# Patient Record
Sex: Male | Born: 1990 | Race: White | Hispanic: No | Marital: Single | Smoking: Current every day smoker
Health system: Southern US, Community
[De-identification: ages and names within clinical notes are randomized; demographics above are authoritative.]

## PROBLEM LIST (undated history)

## (undated) HISTORY — PX: TENDON REPAIR: SHX5111

## (undated) HISTORY — PX: WISDOM TOOTH EXTRACTION: SHX21

---
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
## 2013-02-09 ENCOUNTER — Encounter (HOSPITAL_COMMUNITY): Payer: Self-pay | Admitting: Emergency Medicine

## 2013-02-09 ENCOUNTER — Emergency Department (HOSPITAL_COMMUNITY)
Admission: EM | Admit: 2013-02-09 | Discharge: 2013-02-10 | Disposition: A | Discharge status: Home / Self Care | Payer: 59 | Attending: Emergency Medicine | Admitting: Emergency Medicine

## 2013-02-09 ENCOUNTER — Emergency Department (HOSPITAL_COMMUNITY): Payer: 59

## 2013-02-09 DIAGNOSIS — Y9389 Activity, other specified: Secondary | ICD-10-CM | POA: Insufficient documentation

## 2013-02-09 DIAGNOSIS — W540XXA Bitten by dog, initial encounter: Secondary | ICD-10-CM | POA: Insufficient documentation

## 2013-02-09 DIAGNOSIS — Y929 Unspecified place or not applicable: Secondary | ICD-10-CM | POA: Insufficient documentation

## 2013-02-09 DIAGNOSIS — F17200 Nicotine dependence, unspecified, uncomplicated: Secondary | ICD-10-CM | POA: Insufficient documentation

## 2013-02-09 DIAGNOSIS — S61409A Unspecified open wound of unspecified hand, initial encounter: Secondary | ICD-10-CM | POA: Insufficient documentation

## 2013-02-09 DIAGNOSIS — S61451A Open bite of right hand, initial encounter: Secondary | ICD-10-CM

## 2013-02-09 DIAGNOSIS — IMO0002 Reserved for concepts with insufficient information to code with codable children: Secondary | ICD-10-CM | POA: Insufficient documentation

## 2013-02-09 NOTE — ED Notes (Signed)
Pt. presents with dog bite / puncture wound at right posterior hand this evening , pt. stated his dog's immunizations are complete , mild bleeding / swelling .

## 2013-02-10 MED ORDER — HYDROCODONE-ACETAMINOPHEN 5-325 MG PO TABS
1.0000 | ORAL_TABLET | Freq: Once | ORAL | Status: AC
  Administered 2013-02-10: 1 via ORAL
  Filled 2013-02-10: qty 1

## 2013-02-10 MED ORDER — CEPHALEXIN 500 MG PO CAPS: 500.0000 mg | ORAL_CAPSULE | Freq: Four times a day (QID) | ORAL | Status: DC

## 2013-02-10 MED ORDER — HYDROCODONE-ACETAMINOPHEN 5-325 MG PO TABS: 1.0000 | ORAL_TABLET | ORAL | Status: DC | PRN

## 2013-02-10 NOTE — Discharge Instructions (Signed)

## 2013-02-10 NOTE — ED Provider Notes (Signed)
CSN: [id number]     Arrival date & time 02/09/13  2319 History   First MD Initiated Contact with Patient 02/10/13 0001     Chief Complaint  Patient presents with  . Animal Bite   (Consider location/radiation/quality/duration/timing/severity/associated sxs/prior Treatment) HPI  23 year old male presents for evaluation of recent dogbite. Patient reports he was playing with his pit bull and was bitten on right hand. This happened this evening. Bite was provoked. He was hit once on the right hand suffered a small.bite to the dorsum of his right hand. As he was trying to get away he was scratched and bitten once on his left side of buttock. No other injury noted. He is up-to-date with his tetanus shot. Sts his dog's immunization is UTD.  No specific treatment tried. Denies any numbness.   History reviewed. No pertinent past medical history. Past Surgical History  Procedure Laterality Date  . Tendon repair      L wrist   No family history on file. History  Substance Use Topics  . Smoking status: Current Every Day Smoker  . Smokeless tobacco: Not on file  . Alcohol Use: Yes     Comment: occasionally    Review of Systems  Constitutional: Negative for fever.  Skin: Positive for wound. Negative for rash.  Neurological: Negative for numbness.    Allergies  Review of patient's allergies indicates no known allergies.  Home Medications  No current outpatient prescriptions on file. BP 137/63  Pulse 64  Temp(Src) 98.3 F (36.8 C) (Oral)  Resp 14  Ht 6\' 1"  (1.854 m)  Wt 142 lb (64.411 kg)  BMI 18.74 kg/m2  SpO2 98% Physical Exam  Constitutional: He appears well-developed and well-nourished. No distress.  HENT:  Head: Atraumatic.  Eyes: Conjunctivae are normal.  Neck: Normal range of motion. Neck supple.  Cardiovascular: Intact distal pulses.   Musculoskeletal: He exhibits tenderness (R hand: small 7mm puncture wound to mid dorsum of hand without tendon or nerve injury.  intact  sensation distally, normal ROM to all fingers.  no fb, no crepitus).  Neurological: He is alert.  Skin: No rash noted.  L buttock: superficial abrasion and superficial bite marks to L lateral buttock, minimal tenderness, no fb noted.   Psychiatric: He has a normal mood and affect.    ED Course  Procedures (including critical care time)  12:36 AM Patient was seen far dog bite, he was bitten by his dog.  This is a provoked bite from rough playing.  Has superficial wounds that was cared for in ER with thorough irrigation and dressing.  Will d/c with pain meds, abx, hand specialist referral as needed, and return precaution.  Doubt nerve or tendon injury.  Xray neg for acute fx of R hand.   Labs Review Labs Reviewed - No data to display Imaging Review Dg Hand Complete Right  02/10/2013   CLINICAL DATA:  Dog bite to hand.  Puncture wound.  EXAM: RIGHT HAND - COMPLETE 3+ VIEW  COMPARISON:  None.  FINDINGS: Small laceration over the dorsal hand. No radiodense foreign body. No fracture or other osseous abnormality.  IMPRESSION: No osseous abnormality or radiodense foreign body.   Electronically Signed   By: Tiburcio Pea M.D.   On: 02/10/2013 00:12    EKG Interpretation   None       MDM   1. Dog bite of right hand without complication    BP 137/63  Pulse 64  Temp(Src) 98.3 F (36.8 C) (Oral)  Resp 14  Ht 6\' 1"  (1.854 m)  Wt 142 lb (64.411 kg)  BMI 18.74 kg/m2  SpO2 98%  I have reviewed nursing notes and vital signs. I personally reviewed the imaging tests through PACS system  I reviewed available ER/hospitalization records thought the EMR     Fayrene HelperBowie Tori Cupps, [place] JerseyPA-C 02/10/13 0038

## 2013-02-10 NOTE — ED Provider Notes (Signed)
Medical screening examination/treatment/procedure(s) were performed by non-physician practitioner and as supervising physician I was immediately available for consultation/collaboration.  EKG Interpretation   None         Shon Batonourtney F Horton, MD 02/10/13 [id number]

## 2013-02-10 NOTE — ED Notes (Signed)
Presents with abrasions to dorsum to right hand and left buttock.  Area of abrasions to left buttock in a pattern that resembled a tooth pattern.  Brandy EDT cleansed wounds with SAF-Clens AF, covered all with film of bacitracin.  Hand and buttock was dressed as PA requested.  Pt tolerated well.

## 2013-08-24 ENCOUNTER — Emergency Department (HOSPITAL_COMMUNITY): Payer: 59

## 2013-08-24 ENCOUNTER — Encounter (HOSPITAL_COMMUNITY): Payer: Self-pay | Admitting: Emergency Medicine

## 2013-08-24 ENCOUNTER — Emergency Department (HOSPITAL_COMMUNITY)
Admission: EM | Admit: 2013-08-24 | Discharge: 2013-08-24 | Disposition: A | Discharge status: Home / Self Care | Payer: 59 | Attending: Emergency Medicine | Admitting: Emergency Medicine

## 2013-08-24 DIAGNOSIS — Y9389 Activity, other specified: Secondary | ICD-10-CM | POA: Diagnosis not present

## 2013-08-24 DIAGNOSIS — M795 Residual foreign body in soft tissue: Secondary | ICD-10-CM

## 2013-08-24 DIAGNOSIS — W540XXA Bitten by dog, initial encounter: Secondary | ICD-10-CM | POA: Insufficient documentation

## 2013-08-24 DIAGNOSIS — Y929 Unspecified place or not applicable: Secondary | ICD-10-CM | POA: Insufficient documentation

## 2013-08-24 DIAGNOSIS — F17200 Nicotine dependence, unspecified, uncomplicated: Secondary | ICD-10-CM | POA: Diagnosis not present

## 2013-08-24 DIAGNOSIS — S41109A Unspecified open wound of unspecified upper arm, initial encounter: Secondary | ICD-10-CM | POA: Diagnosis not present

## 2013-08-24 DIAGNOSIS — Z792 Long term (current) use of antibiotics: Secondary | ICD-10-CM | POA: Diagnosis not present

## 2013-08-24 DIAGNOSIS — S41152A Open bite of left upper arm, initial encounter: Secondary | ICD-10-CM

## 2013-08-24 MED ORDER — AMOXICILLIN-POT CLAVULANATE 875-125 MG PO TABS
1.0000 | ORAL_TABLET | Freq: Once | ORAL | Status: AC
  Administered 2013-08-24: 1 via ORAL
  Filled 2013-08-24: qty 1

## 2013-08-24 MED ORDER — AMOXICILLIN-POT CLAVULANATE 875-125 MG PO TABS: 1.0000 | ORAL_TABLET | Freq: Two times a day (BID) | ORAL | Status: AC

## 2013-08-24 MED ORDER — LIDOCAINE-EPINEPHRINE 2 %-1:100000 IJ SOLN
1.7000 mL | Freq: Once | INTRAMUSCULAR | Status: DC
  Filled 2013-08-24: qty 1.7

## 2013-08-24 MED ORDER — KETOROLAC TROMETHAMINE 60 MG/2ML IM SOLN
60.0000 mg | Freq: Once | INTRAMUSCULAR | Status: AC
  Administered 2013-08-24: 60 mg via INTRAMUSCULAR
  Filled 2013-08-24: qty 2

## 2013-08-24 MED ORDER — HYDROCODONE-ACETAMINOPHEN 5-325 MG PO TABS: 1.0000 | ORAL_TABLET | Freq: Four times a day (QID) | ORAL | Status: AC | PRN

## 2013-08-24 MED ORDER — OXYCODONE-ACETAMINOPHEN 5-325 MG PO TABS
1.0000 | ORAL_TABLET | Freq: Once | ORAL | Status: AC
  Administered 2013-08-24: 1 via ORAL
  Filled 2013-08-24: qty 1

## 2013-08-24 NOTE — ED Provider Notes (Signed)
CSN: [id number]     Arrival date & time 08/24/13  8295 History  This chart was scribed for non-physician practitioner Raymon Mutton, PA-C working with Shon Baton, MD by Leone Payor, ED Scribe. This patient was seen in room TR07C/TR07C and the patient's care was started at 10:56 AM.    Chief Complaint  Patient presents with  . Animal Bite    The history is provided by the patient. No language interpreter was used.    HPI Comments: Greg Greene is a 23 y.o. male who presents to the Emergency Department complaining of a dog bite to the left forearm that occurred 1.5 hours ago. Patient states he was reaching for something when his pit bull latched on to the left forearm. Patient states the dog is UTD with all vaccinations. He describes the pain as dull, aching, and throbbing. He states his pain was initially 7-8/10 but 4-5/10 currently. He reports a prior injury to the same area; split tendon 2 years ago which was repaired by Dr. Merlyn Lot. Last tetanus was < 5 years ago. He denies numbness, paresthesias, weakness. He states the bleeding is now controlled.    History reviewed. No pertinent past medical history. Past Surgical History  Procedure Laterality Date  . Tendon repair      L wrist  . Wisdom tooth extraction     No family history on file. History  Substance Use Topics  . Smoking status: Current Every Day Smoker  . Smokeless tobacco: Not on file  . Alcohol Use: No     Comment: former    Review of Systems  Musculoskeletal: Positive for myalgias.  Skin: Positive for wound (4 puncture wounds to left forearm).  Neurological: Negative for weakness and numbness.      Allergies  Review of patient's allergies indicates no known allergies.  Home Medications   Prior to Admission medications   Medication Sig Start Date End Date Taking? Authorizing Provider  amoxicillin-clavulanate (AUGMENTIN) 875-125 MG per tablet Take 1 tablet by mouth 2 (two) times daily. One po bid x 7  days 08/24/13   Doyel Mulkern, PA-C  HYDROcodone-acetaminophen (NORCO/VICODIN) 5-325 MG per tablet Take 1 tablet by mouth every 6 (six) hours as needed. 08/24/13   Bodi Palmeri, PA-C   BP 137/89  Pulse 69  Temp(Src) 97.7 F (36.5 C) (Oral)  Resp 18  Ht  (1.854 m)  Wt 145 lb (65.772 kg)  BMI 19.13 kg/m2  SpO2 100% Physical Exam  Nursing note and vitals reviewed. Constitutional: He is oriented to person, place, and time. He appears well-developed and well-nourished.  HENT:  Head: Normocephalic and atraumatic.  Eyes: Conjunctivae and EOM are normal. Pupils are equal, round, and reactive to light. Right eye exhibits no discharge. Left eye exhibits no discharge.  Neck: Normal range of motion. Neck supple. No tracheal deviation present.  Cardiovascular: Normal rate, regular rhythm and normal heart sounds.  Exam reveals no friction rub.   No murmur heard. Pulses:      Radial pulses are 2+ on the right side, and 2+ on the left side.  Cap refill less than 3 seconds  Pulmonary/Chest: Effort normal and breath sounds normal. No respiratory distress. He has no wheezes. He has no rales.  Musculoskeletal: Normal range of motion. He exhibits tenderness.       Arms: 3 puncture wounds identified to the flexor and extensor surface of the left forearm with bleeding controlled. Patient is full range of motion to the left elbow. Full  range of motion to the left wrist and digits of the left hand without difficulty or ataxia - full flexion, extension. Full abduction, adduction, flexion and extension of the digits of the left hand. Full pronation supination noted. Patient is able to make a fist without difficulty.  Full ROM to upper and lower extremities without difficulty noted, negative ataxia noted.  Lymphadenopathy:    He has no cervical adenopathy.  Neurological: He is alert and oriented to person, place, and time. No cranial nerve deficit. He exhibits normal muscle tone. Coordination normal.   Cranial nerves III-XII grossly intact Strength 5+/5+ to upper extremities bilaterally with resistance applied, equal distribution noted Strength intact to MCP, PIP, DIP joints of left hand Sensation intact with differentiation sharp and dull touch Patient able to make a fist   Skin: Skin is warm and dry. No rash noted. No erythema.  Please see musculoskeletal  Psychiatric: He has a normal mood and affect. His behavior is normal. Thought content normal.    ED Course  FOREIGN BODY REMOVAL Date/Time: 08/24/2013 1:22 PM Performed by: Raymon Mutton Authorized by: Raymon Mutton Consent: Verbal consent obtained. Risks and benefits: risks, benefits and alternatives were discussed Consent given by: patient Patient understanding: patient states understanding of the procedure being performed Patient consent: the patient's understanding of the procedure matches consent given Procedure consent: procedure consent matches procedure scheduled Patient identity confirmed: verbally with patient and arm band Body area: skin General location: upper extremity Location details: left forearm Anesthesia: local infiltration Local anesthetic: lidocaine 2% with epinephrine Anesthetic total: 5 ml Patient sedated: no Patient restrained: no Removal mechanism: irrigation and forceps Dressing: dressing applied Tendon involvement: none Depth: deep Complexity: complex 0 objects recovered. Objects recovered: 0 Post-procedure assessment: residual foreign bodies remain Patient tolerance: Patient tolerated the procedure well with no immediate complications. Comments: Foreign body measuring approximately 6 mm on x-ray identified into the left forearm. Unable to remove, unsuccessful retrieval of the foreign body.    (including critical care time)  DIAGNOSTIC STUDIES: Oxygen Saturation is 100% on RA, normal by my interpretation.    COORDINATION OF CARE: 11:02 AM Will order XRAY of left forearm.   Discussed treatment plan with pt at bedside and pt agreed to plan.   Labs Review Labs Reviewed - No data to display  Imaging Review Dg Forearm Left  08/24/2013   CLINICAL DATA:  Dog bite.  EXAM: LEFT FOREARM - 2 VIEW  COMPARISON:  None.  FINDINGS: No acute fracture or dislocation. Soft tissue injuries identified primarily about the radial aspect of the forearm. Radiopaque foreign object measures 6 mm and is positioned adjacent to the radial shaft.  IMPRESSION: Soft tissue injury with 6 mm radiopaque foreign object adjacent to the radial shaft.   Electronically Signed   By: Jeronimo Greaves M.D.   On: 08/24/2013 11:28     EKG Interpretation None      MDM   Final diagnoses:  Dog bite of arm, left, initial encounter  Foreign body (FB) in soft tissue    Medications  lidocaine-EPINEPHrine (XYLOCAINE W/EPI) 2 %-1:100000 (with pres) injection 1.7 mL (not administered)  oxyCODONE-acetaminophen (PERCOCET/ROXICET) 5-325 MG per tablet 1 tablet (1 tablet Oral Given 08/24/13 1011)  ketorolac (TORADOL) injection 60 mg (60 mg Intramuscular Given 08/24/13 1037)  amoxicillin-clavulanate (AUGMENTIN) 875-125 MG per tablet 1 tablet (1 tablet Oral Given 08/24/13 1135)   Filed Vitals:   08/24/13 0953  BP: 137/89  Pulse: 69  Temp: 97.7 F (36.5 C)  TempSrc: Oral  Resp: 18  Height:  (1.854 m)  Weight: 145 lb (65.772 kg)  SpO2: 100%   I personally performed the services described in this documentation, which was scribed in my presence. The recorded information has been reviewed and is accurate.  Plain film of left forearm negative for fracture or acute osseous injury. Approximately 6 mm foreign body identified in the soft tissues adjacent to the radial shaft. Attempted to remove foreign body that was unsuccessful-patient is aware that foreign body still placed in the left forearm. Negative involvement of tendon and deep tendon. Full range of motion to left wrist-full flexion extension noted, pronation  supination identified. Full range of motion to the digits of the left hand without difficulty. Strength intact MCP, PIP, DIP joints. Radial pulses strong. Sensation intact. Negative focal neurological deficits noted. Patient stable, afebrile. Patient not septic appearing. Discussed case in great detail with attending physician regarding case and inability to get foreign body out, who agreed to plan of discharge - reported that wounds are to be kept open, but covered. Discharged patient. Discharge patient with antibiotics. Discussed with patient wound care. Discussed with patient to followup with hand specialist. Discussed with patient that when these be reassessed within approximately 2 days. Discussed with patient to closely monitor symptoms and if symptoms are to worsen or change to report back to the ED - strict return instructions given.  Patient agreed to plan of care, understood, all questions answered.   Raymon Mutton, PA-C 08/24/13 1907

## 2013-08-24 NOTE — ED Provider Notes (Signed)
Medical screening examination/treatment/procedure(s) were performed by non-physician practitioner and as supervising physician I was immediately available for consultation/collaboration.   EKG Interpretation None        Shon Batonourtney F Horton, MD 08/24/13 1940

## 2013-08-24 NOTE — ED Notes (Signed)
Pt here for dog bite to left forearm area. Pt bit by his own pit bull and dog is up to date on all his shots. Bleeding controlled. Pt with 4 puncture wounds.

## 2013-08-24 NOTE — Discharge Instructions (Signed)
Please call and set up an appointment with hand specialist to be seen and assessed Please keep wounds covered at all times and keep the wound dry Please avoid any physical or shortness activity Please do not place any ointments on the wounds Please wash at least 3 times per day with warm water and soap Please take antibiotics as prescribed and on a full stomach Please take pain medications as prescribed. While on pain medications his be no drinking alcohol, driving, operating any heavy machinery. If there is extra please disposer proper manner. Please do not take any Tylenol for this can lead to Tylenol overdose and liver issues. Please continue to monitor symptoms closely and if symptoms are to worsen or change (fever greater than 101, chills, chest pain, shortness of breath, difficulty breathing, swelling to the arm, pus drainage, redness, or hot to the touch, red streaks running up and down the arm, swelling to the fingers, decreased range of motion, fall, injury) please report back to the ED immediately   Animal Bite An animal bite can result in a scratch on the skin, deep open cut, puncture of the skin, crush injury, or tearing away of the skin or a body part. Dogs are responsible for most animal bites. Children are bitten more often than adults. An animal bite can range from very mild to more serious. A small bite from your house pet is no cause for alarm. However, some animal bites can become infected or injure a bone or other tissue. You must seek medical care if:  The skin is broken and bleeding does not slow down or stop after 15 minutes.  The puncture is deep and difficult to clean (such as a cat bite).  Pain, warmth, redness, or pus develops around the wound.  The bite is from a stray animal or rodent. There may be a risk of rabies infection.  The bite is from a snake, raccoon, skunk, fox, coyote, or bat. There may be a risk of rabies infection.  The person bitten has a chronic  illness such as diabetes, liver disease, or cancer, or the person takes medicine that lowers the immune system.  There is concern about the location and severity of the bite. It is important to clean and protect an animal bite wound right away to prevent infection. Follow these steps:  Clean the wound with plenty of water and soap.  Apply an antibiotic cream.  Apply gentle pressure over the wound with a clean towel or gauze to slow or stop bleeding.  Elevate the affected area above the heart to help stop any bleeding.  Seek medical care. Getting medical care within 8 hours of the animal bite leads to the best possible outcome. DIAGNOSIS  Your caregiver will most likely:  Take a detailed history of the animal and the bite injury.  Perform a wound exam.  Take your medical history. Blood tests or X-rays may be performed. Sometimes, infected bite wounds are cultured and sent to a lab to identify the infectious bacteria.  TREATMENT  Medical treatment will depend on the location and type of animal bite as well as the patient's medical history. Treatment may include:  Wound care, such as cleaning and flushing the wound with saline solution, bandaging, and elevating the affected area.  Antibiotics.  Tetanus immunization.  Rabies immunization.  Leaving the wound open to heal. This is often done with animal bites, due to the high risk of infection. However, in certain cases, wound closure with stitches, wound  adhesive, skin adhesive strips, or staples may be used. Infected bites that are left untreated may require intravenous (IV) antibiotics and surgical treatment in the hospital. HOME CARE INSTRUCTIONS  Follow your caregiver's instructions for wound care.  Take all medicines as directed.  If your caregiver prescribes antibiotics, take them as directed. Finish them even if you start to feel better.  Follow up with your caregiver for further exams or immunizations as directed. You  may need a tetanus shot if:  You cannot remember when you had your last tetanus shot.  You have never had a tetanus shot.  The injury broke your skin. If you get a tetanus shot, your arm may swell, get red, and feel warm to the touch. This is common and not a problem. If you need a tetanus shot and you choose not to have one, there is a rare chance of getting tetanus. Sickness from tetanus can be serious. SEEK MEDICAL CARE IF:  You notice warmth, redness, soreness, swelling, pus discharge, or a bad smell coming from the wound.  You have a red line on the skin coming from the wound.  You have a fever, chills, or a general ill feeling.  You have nausea or vomiting.  You have continued or worsening pain.  You have trouble moving the injured part.  You have other questions or concerns. MAKE SURE YOU:  Understand these instructions.  Will watch your condition.  Will get help right away if you are not doing well or get worse. Document Released: 09/13/2010 Document Revised: 03/20/2011 Document Reviewed: 09/13/2010 [facility] Patient Information 2015 [place], [place]. This information is not intended to replace advice given to you by your health care provider. Make sure you discuss any questions you have with your health care provider.   Emergency Department Resource Guide 1) Find a Doctor and Pay Out of Pocket Although you won't have to find out who is covered by your insurance plan, it is a good idea to ask around and get recommendations. You will then need to call the office and see if the doctor you have chosen will accept you as a new patient and what types of options they offer for patients who are self-pay. Some doctors offer discounts or will set up payment plans for their patients who do not have insurance, but you will need to ask so you aren't surprised when you get to your appointment.  2) Contact Your Local Health Department Not all health departments have doctors that can  see patients for sick visits, but many do, so it is worth a call to see if yours does. If you don't know where your local health department is, you can check in your phone book. The CDC also has a tool to help you locate your state's health department, and many state websites also have listings of all of their local health departments.  3) Find a Walk-in Clinic If your illness is not likely to be very severe or complicated, you may want to try a walk in clinic. These are popping up all over the country in pharmacies, drugstores, and shopping centers. They're usually staffed by nurse practitioners or physician assistants that have been trained to treat common illnesses and complaints. They're usually fairly quick and inexpensive. However, if you have serious medical issues or chronic medical problems, these are probably not your best option.  No Primary Care Doctor: - Call Health Connect at  [phone] - they can help you locate a primary care doctor that  accepts your insurance, provides certain services, etc. - Physician Referral Service- [phone]  Chronic Pain Problems: Organization         Address  Phone   Notes  [facility]  [phone]) [phone] Patients need to be referred by their primary care doctor.   Medication Assistance: Organization         Address  Phone   Notes  [facility] Medication [facility] [address] [place], [place] [address] [phone]) [phone] --Must be a resident of [facility] -- Must have NO insurance coverage whatsoever (no Medicaid/ Medicare, etc.) -- The pt. MUST have a primary care doctor that directs their care regularly and follows them in the community   MedAssist  [phone]   [employer]  [phone]    Agencies that provide inexpensive medical care: Organization         Address  Phone   Notes  [facility]  [phone]   [facility]    [phone]   [facility] [address] [place], [place] [address] [phone]   [facility] [address], [place] [phone]) [phone]   [facility]anned Parenthood    [phone]   [facility]    [phone]) [phone]   [facility]  [address], [place] Phone:  [phone], Fax:  [phone] Hours of Operation:  9 am - 6 pm, M-F.  Also accepts Medicaid/Medicare and self-pay.  [facility]  [address], [place] Phone: [phone], Fax: [phone]) [phone]. Hours of Operation:  8:30 am - 5:30 pm, M-F.  Also accepts Medicaid and self-pay.  [facility] [place] [address], [place] Phone: [phone]   [facility] [address], [place], [place] [phone], Ext. [phone] Mondays & Thursdays: 7-9 AM.  First 15 patients are seen on a first come, first serve basis.    Medicaid-accepting [facility] Providers:  Organization         Address  Phone   Notes  [facility] [address], [place] [phone] Also accepts self-pay patients.  [facility] [address], [place]  [phone]) [phone]   [facility] [address], [place] [phone]) [phone]   [facility] [address] [place] [address], [place] [phone]) [phone]   Renaye RakersVeita Bland [address], [place]   [phone] Only accepts [place] Access [place] patients after they have their name applied to their card.   Self-Pay (no insurance) in [facility]:  Organization         Address  Phone   Notes  Sickle Cell Patients, [facility] [address], [place] [phone]   [facility] [address], [place] [phone]   [facility] [place]  [address], [place] [phone]) [phone]   [facility]/Dr. Osei-Bonsu   [address] [place] [address], [place] or [phone], [place] [phone] Phone number for both [place] and [place] locations is the same.  [facility] [address], [place] [phone]   [facility] [place] [address] [place] [address], [place] or [address] [phone]   [facility] [address] [place] [phone], phone; [phone], fax Sees patients 1st and 3rd Saturday of every month.  Must not qualify for public or private insurance (i.e. Medicaid, Medicare, [place] Health Choice, Veterans' Benefits)  Household income should be no more than 200% of the poverty level The clinic cannot treat you if you are pregnant or think you are pregnant  Sexually transmitted diseases are not treated at the clinic.    Dental Care: Organization         Address  Phone  Notes  [facility] [address] [place], [place] [phone] Accepts children up to age 3 who are enrolled in [place] or [place] Health Choice; pregnant women with a Medicaid card; and children who have applied for Medicaid or [place] Health Choice, but were declined, whose parents can pay a reduced fee at time of service.  [facility] of [facility]  [address], [place] [phone] Accepts children up to age 25 who are enrolled in [place] or [place] Health Choice; pregnant women with a Medicaid card; and children who have applied for Medicaid or [place] Health Choice, but were declined, whose parents can pay a reduced fee at time of service.  [facility]  [address] [place], [place] [phone] Patients are seen by appointment only. Walk-ins are not accepted. [facility] will see patients 56 years of age and older. Monday - Tuesday (8am-5pm) Most Wednesdays (8:30-5pm) $30 per visit, cash only  [facility] Adult Dental Access  PROGRAM  [address], [facility] [phone] Patients are seen by appointment only. Walk-ins are not accepted. [facility] will see patients 65 years of age and older. One Wednesday Evening (Monthly: Volunteer Based).  $30 per visit, cash only  [employer]  [phone] for adults; Children under age 82, call Graduate Pediatric Dentistry at [phone]. Children aged 66-14, please call [phone] to request a pediatric application.  Dental services are provided in all areas of dental care including fillings, crowns and bridges, complete and partial dentures, implants, gum treatment, root canals, and extractions. Preventive care is also provided. Treatment is provided to both adults and children. Patients are selected via a lottery and there is often a waiting list.   [facility] [address], [place]  [phone] [email or web address]   [facility] [address] [place], [place] [phone], Ext. [phone] Second and Fourth Thursday of each month, opens at 6:30 AM; Clinic ends at 9 AM.  Patients are seen on a first-come first-served basis, and a limited number are seen during each clinic.   [facility]  [address] [place] [place], [place] [phone]   Eligibility Requirements You must have lived in [place], [place], or [place] counties for at least the last three months.   You cannot be eligible for state or federal sponsored [place], including [employer], [place], or [employer].   You generally cannot be eligible for healthcare insurance through your employer.    How to apply: Eligibility screenings are held every Tuesday and Wednesday afternoon from 1:00 pm until 4:00 pm. You do not need an appointment for the interview!  [facility] [address] [place], [place] [phone]   [facility] Health Department  [phone]   [facility] Health Department   [phone]   [facility] Department  [phone]    Behavioral Health Resources in the Community: Intensive Outpatient Programs Organization         Address  Phone  Notes  [facility] [address] [place], [place] [phone]   [facility] Outpatient [address], [place], [place] [phone]   [facility] [address] [place], [place]  [phone]   [facility] Mental Health [address],  [place], [place] [phone] or [phone]   Substance Abuse Resources Organization         Address  Phone  Notes  [facility] Drug Services  [phone]   [facility]  [phone]   [facility]  [phone]   Floydene Flock  [phone]   Residential & Outpatient Substance Abuse Program  [phone]   Psychological Services Organization         Address  Phone  Notes  [facility]  [phone]   [facility] [place] Services  [phone]   [facility] [address] [place] [phone] or [phone]    Mobile Crisis Teams Organization         Address  Phone  Notes  [facility], Mobile Crisis Care Unit  [phone]   [facility]  [address] [place], [place] [phone]   Doristine Locks [address] [place] [phone]    Self-Help/Support Groups Organization         Address  Phone             Notes  [facility] [place] - variety of support groups  [phone]- I7437963 Call for more information  Narcotics Anonymous (NA), [facility] [address], [employer] [place]  2 meetings at this location   Statistician         Address  Phone  Notes  [facility] [address],    [place]  [phone]   [facility]  [address] [place] [address], [place], [place] [phone]   [facility] [address]  [place], [place] [phone] Admissions: 8am-3pm M-F  [facility] [address]    [place], [place] [phone]   [facility] [address] [place], [place], [place] [phone]   [facility] [address]  [place], [place] [phone]   [facility] - Intensive Outpatient [address], [place], [place] [phone]   [facility] ([facility]iction [facility] [address]  [place], [place] [phone] or [phone]   [facility] ([facility]) [address], [place], [place] [phone] Accepts Medicaid  [facility] [address]  [place] [phone] Substance Abuse/Addiction Treatment   [facility] Organization         Address  Phone  Notes  [facility]  [phone]   Angie Fava, PhD [address]vin Knack [place], [place]   [phone] or [phone]   [facility] Behavioral   [address] [place], [place] [phone]   [facility] [address], [place], [place] [phone] Insurance/Medicaid/sponsorship through [employer] and Families [address]                                    [place], [place] [phone] Therapy/tele-psych/case  [facility] [address]  [place], [place] [phone]    Dr. Adele Schilder  [phone]   [facility] Dept. 1) [address] [place] 2) [place] 3)  [address], [place] [phone]  [phone]   [place] [phone] or [phone] (After Hours)

## 2013-09-02 NOTE — ED Provider Notes (Signed)
Medical screening examination/treatment/procedure(s) were performed by non-physician practitioner and as supervising physician I was immediately available for consultation/collaboration.   EKG Interpretation None        Shon Baton, MD 09/02/13 2249

## 2013-09-02 NOTE — ED Provider Notes (Signed)
6:34 PM This provider spoke with the patient via telephone. Patient reported that his hand is healing well. Stated that he has been washing it as instructed and keeping it wrapped. Patient reported that he has been taking his antibiotics as prescribed. Stated that he continues to have full range of motion and strength in his hand, stated that he has been working without difficulty. Denied swelling, redness, hot to the touch, numbness, tingling. Denied signs of infection. This provider asked if patient has followed up with hand as instructed - patient denied. Highly recommended patient to follow-up with hand specialist/ED. Discussed with patient to closely monitor symptoms and if symptoms are to worsen or change to report back to the ED - strict return instructions given. Patient agreed to plan of care, understood, all questions answered.   Raymon Mutton, PA-C 09/02/13 2236

## 2013-09-23 IMAGING — CR DG WRIST COMPLETE 3+V*R*
4 series · 4 of 4 positions shown · non-contrast
Comparison: Left wrist 07/17/2010.

CLINICAL DATA: Radial wrist pain

RIGHT WRIST - COMPLETE 3+ VIEW

[x wrist pa right]
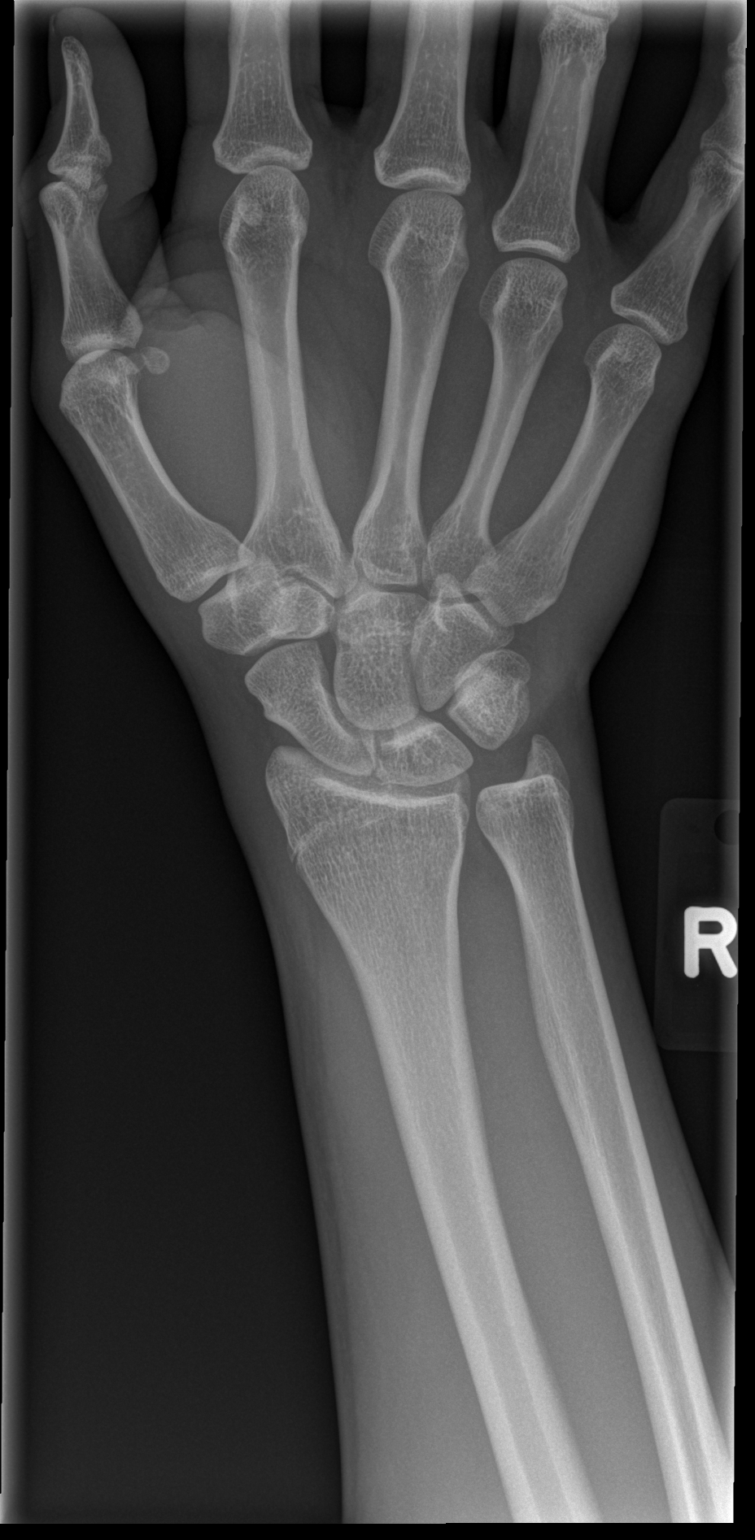

[x wrist obl right]
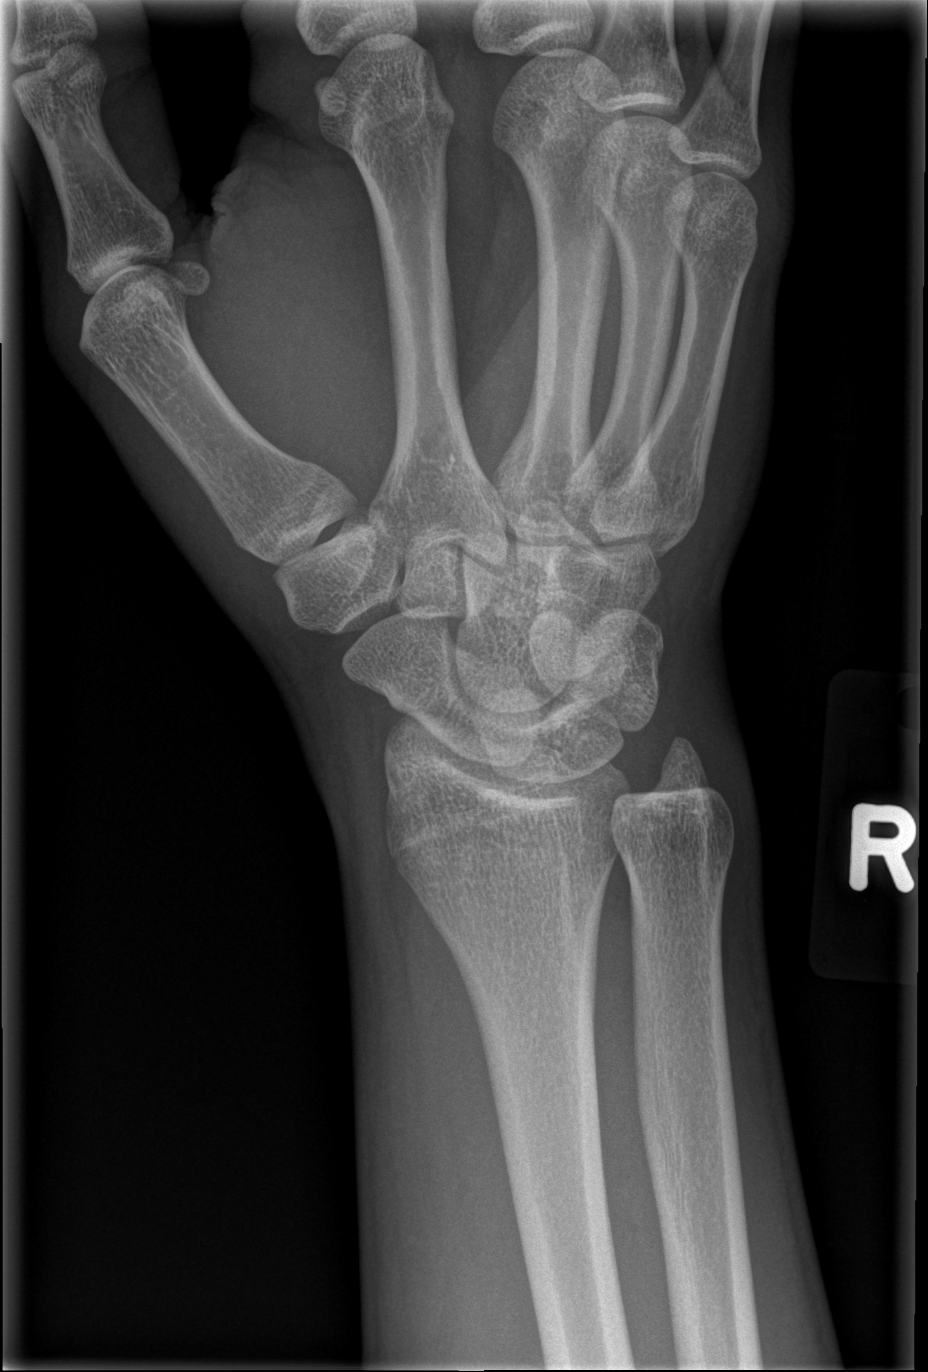

[x wrist lat right]
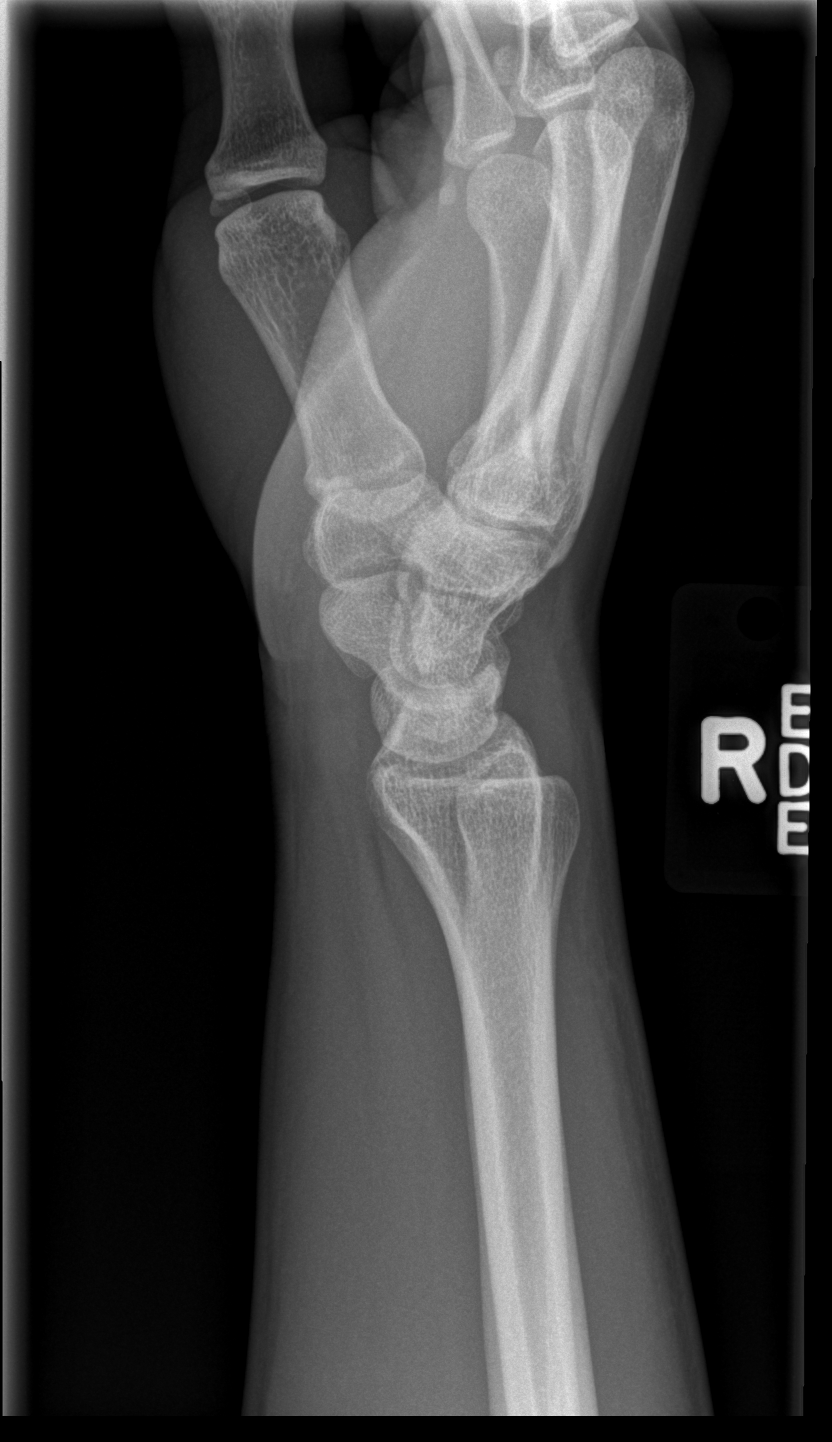

[x wrist navicular view right]
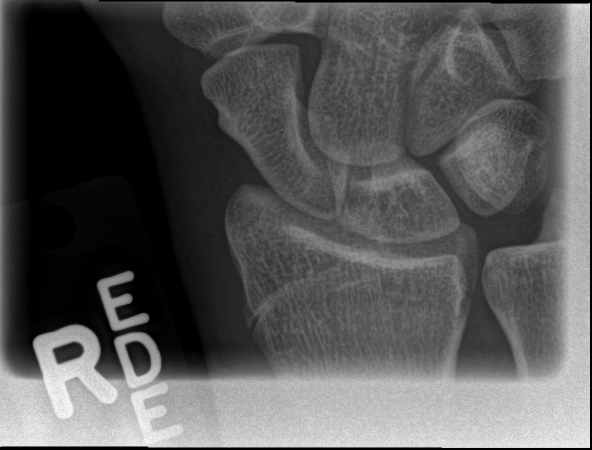

[4 of 4 positions shown; findings below may reference images not displayed]

FINDINGS: No fracture of the distal right radius or ulna.  Growth
plates scars are noted.  The radiocarpal joint is normal.  No
carpal fracture.
IMPRESSION: No right wrist fracture.

## 2015-03-15 IMAGING — CR DG HAND COMPLETE 3+V*R*
3 series · 3 of 3 positions shown · non-contrast
Comparison: None.

CLINICAL DATA: Dog bite to hand.  Puncture wound.

EXAM:
RIGHT HAND - COMPLETE 3+ VIEW

[x hand pa right *]
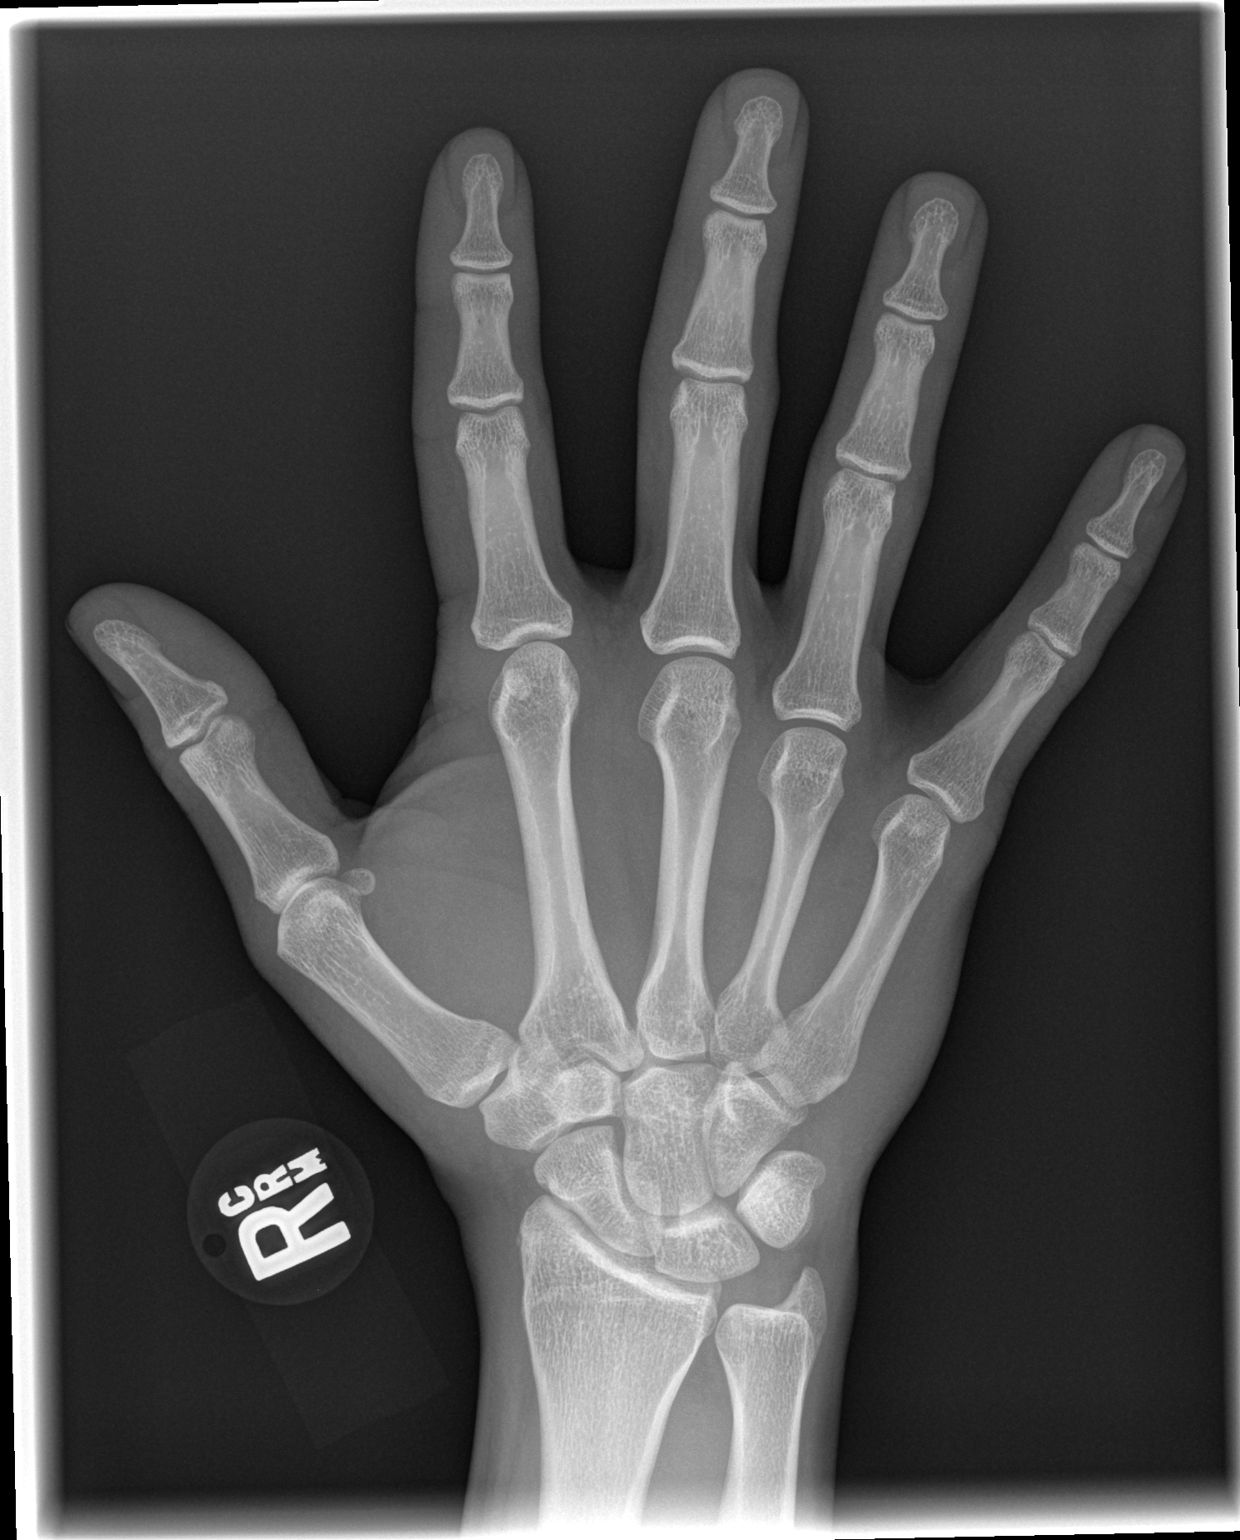

[x hand oblique right *]
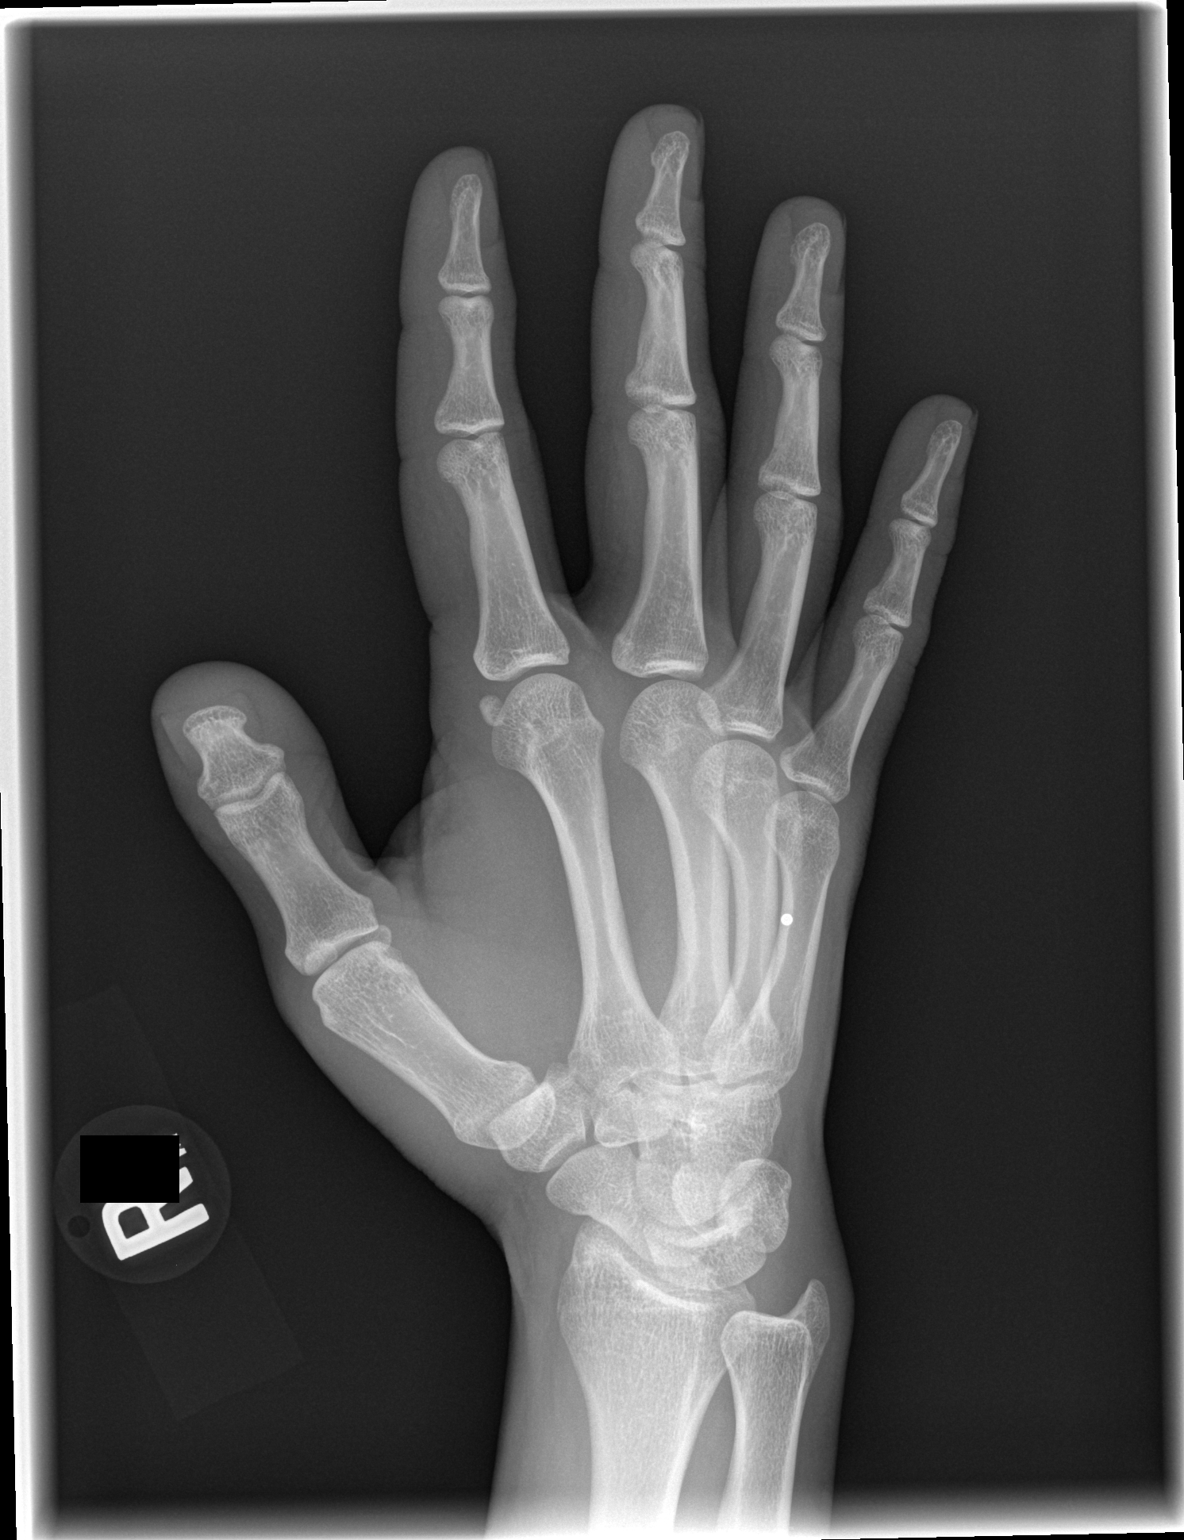

[x hand lat right *]
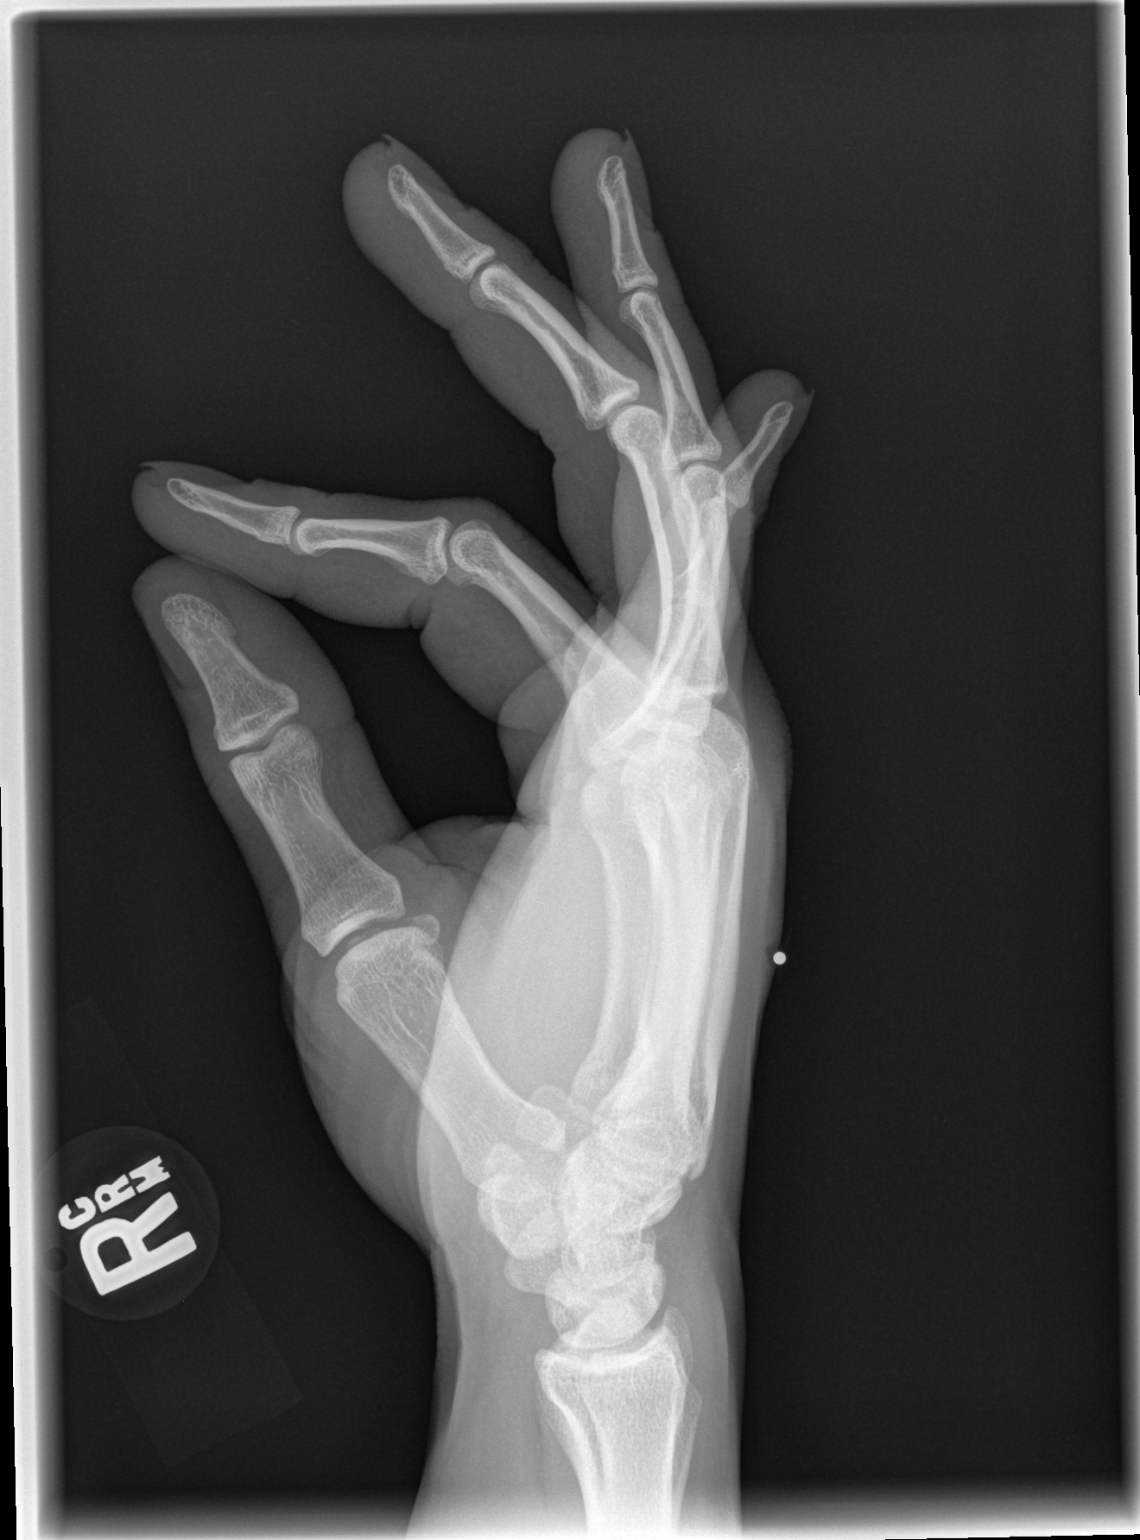

[3 of 3 positions shown; findings below may reference images not displayed]

FINDINGS: Small laceration over the dorsal hand. No radiodense foreign body.
No fracture or other osseous abnormality.
IMPRESSION: No osseous abnormality or radiodense foreign body.
